# Patient Record
Sex: Male | Born: 1995 | Race: White | Hispanic: No | Marital: Single
Health system: Southern US, Community
[De-identification: ages and names within clinical notes are randomized; demographics above are authoritative.]

---

## 2021-01-19 ENCOUNTER — Encounter (HOSPITAL_COMMUNITY): Payer: Self-pay

## 2021-01-19 ENCOUNTER — Observation Stay (HOSPITAL_COMMUNITY)
Admission: EM | Admit: 2021-01-19 | Discharge: 2021-01-20 | Disposition: A | Discharge status: Home / Self Care | Payer: 59 | Attending: Surgery | Admitting: Surgery

## 2021-01-19 DIAGNOSIS — K353 Acute appendicitis with localized peritonitis, without perforation or gangrene: Principal | ICD-10-CM | POA: Insufficient documentation

## 2021-01-19 DIAGNOSIS — U071 COVID-19: Secondary | ICD-10-CM | POA: Insufficient documentation

## 2021-01-19 DIAGNOSIS — R1031 Right lower quadrant pain: Secondary | ICD-10-CM | POA: Diagnosis present

## 2021-01-19 DIAGNOSIS — K3580 Unspecified acute appendicitis: Secondary | ICD-10-CM | POA: Diagnosis present

## 2021-01-19 LAB — CBC
HCT: 42.7 % (ref 39.0–52.0)
Hemoglobin: 14.5 g/dL (ref 13.0–17.0)
MCH: 29.7 pg (ref 26.0–34.0)
MCHC: 34 g/dL (ref 30.0–36.0)
MCV: 87.5 fL (ref 80.0–100.0)
Platelets: 195 10*3/uL (ref 150–400)
RBC: 4.88 MIL/uL (ref 4.22–5.81)
RDW: 12 % (ref 11.5–15.5)
WBC: 16.2 10*3/uL — ABNORMAL HIGH (ref 4.0–10.5)
nRBC: 0 % (ref 0.0–0.2)

## 2021-01-19 LAB — COMPREHENSIVE METABOLIC PANEL
ALT: 30 U/L (ref 0–44)
AST: 24 U/L (ref 15–41)
Albumin: 4.6 g/dL (ref 3.5–5.0)
Alkaline Phosphatase: 58 U/L (ref 38–126)
Anion gap: 9 (ref 5–15)
BUN: 14 mg/dL (ref 6–20)
CO2: 27 mmol/L (ref 22–32)
Calcium: 9.8 mg/dL (ref 8.9–10.3)
Chloride: 101 mmol/L (ref 98–111)
Creatinine, Ser: 1.1 mg/dL (ref 0.61–1.24)
GFR, Estimated: >60 mL/min (ref >60)
Glucose, Bld: 98 mg/dL (ref 70–99)
Potassium: 4 mmol/L (ref 3.5–5.1)
Sodium: 137 mmol/L (ref 135–145)
Total Bilirubin: 1.3 mg/dL — ABNORMAL HIGH (ref 0.3–1.2)
Total Protein: 8.4 g/dL — ABNORMAL HIGH (ref 6.5–8.1)

## 2021-01-19 LAB — URINALYSIS, ROUTINE W REFLEX MICROSCOPIC
Bilirubin Urine: NEGATIVE
Glucose, UA: NEGATIVE mg/dL
Hgb urine dipstick: NEGATIVE
Ketones, ur: NEGATIVE mg/dL
Leukocytes,Ua: NEGATIVE
Nitrite: NEGATIVE
Protein, ur: NEGATIVE mg/dL
Specific Gravity, Urine: 1.016 (ref 1.005–1.030)
pH: 7 (ref 5.0–8.0)

## 2021-01-19 LAB — LIPASE, BLOOD: Lipase: 114 U/L — ABNORMAL HIGH (ref 11–51)

## 2021-01-19 NOTE — ED Triage Notes (Signed)
Pt states that he has been having RLQ abd pain since last night, with n/v, denies diarrhea or fevers. C/o of some dysuria as well

## 2021-01-20 ENCOUNTER — Observation Stay (HOSPITAL_COMMUNITY): Payer: 59 | Admitting: Anesthesiology

## 2021-01-20 ENCOUNTER — Encounter (HOSPITAL_COMMUNITY)
Admission: EM | Disposition: A | Discharge status: Home / Self Care | Payer: Self-pay | Source: Home / Self Care | Attending: Emergency Medicine

## 2021-01-20 ENCOUNTER — Emergency Department (HOSPITAL_COMMUNITY): Payer: 59

## 2021-01-20 DIAGNOSIS — K3580 Unspecified acute appendicitis: Secondary | ICD-10-CM | POA: Diagnosis present

## 2021-01-20 HISTORY — PX: LAPAROSCOPIC APPENDECTOMY: SHX408

## 2021-01-20 LAB — RESP PANEL BY RT-PCR (FLU A&B, COVID) ARPGX2
Influenza A by PCR: NEGATIVE
Influenza B by PCR: NEGATIVE
SARS Coronavirus 2 by RT PCR: POSITIVE — AB

## 2021-01-20 LAB — HIV ANTIBODY (ROUTINE TESTING W REFLEX): HIV Screen 4th Generation wRfx: NONREACTIVE

## 2021-01-20 SURGERY — APPENDECTOMY, LAPAROSCOPIC
Anesthesia: General

## 2021-01-20 MED ORDER — FENTANYL CITRATE (PF) 250 MCG/5ML IJ SOLN
INTRAMUSCULAR | Status: AC
  Filled 2021-01-20: qty 5

## 2021-01-20 MED ORDER — ROCURONIUM BROMIDE 10 MG/ML (PF) SYRINGE
PREFILLED_SYRINGE | INTRAVENOUS | Status: AC
  Filled 2021-01-20: qty 10

## 2021-01-20 MED ORDER — DEXAMETHASONE SODIUM PHOSPHATE 10 MG/ML IJ SOLN
INTRAMUSCULAR | Status: DC | PRN
  Administered 2021-01-20: 5 mg via INTRAVENOUS

## 2021-01-20 MED ORDER — ACETAMINOPHEN 325 MG PO TABS
ORAL_TABLET | ORAL | Status: AC
  Filled 2021-01-20: qty 2

## 2021-01-20 MED ORDER — ONDANSETRON HCL 4 MG/2ML IJ SOLN
4.0000 mg | Freq: Once | INTRAMUSCULAR | Status: AC
  Administered 2021-01-20: 4 mg via INTRAVENOUS
  Filled 2021-01-20: qty 2

## 2021-01-20 MED ORDER — FENTANYL CITRATE (PF) 250 MCG/5ML IJ SOLN
INTRAMUSCULAR | Status: DC | PRN
  Administered 2021-01-20 (×2): 50 ug via INTRAVENOUS
  Administered 2021-01-20: 150 ug via INTRAVENOUS

## 2021-01-20 MED ORDER — SIMETHICONE 80 MG PO CHEW: 40.0000 mg | CHEWABLE_TABLET | Freq: Four times a day (QID) | ORAL | Status: DC | PRN

## 2021-01-20 MED ORDER — IOHEXOL 300 MG/ML  SOLN
100.0000 mL | Freq: Once | INTRAMUSCULAR | Status: AC | PRN
  Administered 2021-01-20: 100 mL via INTRAVENOUS

## 2021-01-20 MED ORDER — HYDRALAZINE HCL 20 MG/ML IJ SOLN: 10.0000 mg | INTRAMUSCULAR | Status: DC | PRN

## 2021-01-20 MED ORDER — 0.9 % SODIUM CHLORIDE (POUR BTL) OPTIME
TOPICAL | Status: DC | PRN
  Administered 2021-01-20: 1000 mL

## 2021-01-20 MED ORDER — MORPHINE SULFATE (PF) 4 MG/ML IV SOLN
4.0000 mg | Freq: Once | INTRAVENOUS | Status: AC
  Administered 2021-01-20: 4 mg via INTRAVENOUS
  Filled 2021-01-20: qty 1

## 2021-01-20 MED ORDER — IOHEXOL 9 MG/ML PO SOLN: 500.0000 mL | ORAL | Status: DC

## 2021-01-20 MED ORDER — PIPERACILLIN-TAZOBACTAM 3.375 G IVPB
3.3750 g | Freq: Three times a day (TID) | INTRAVENOUS | Status: DC
  Administered 2021-01-20: 3.375 g via INTRAVENOUS
  Filled 2021-01-20 (×2): qty 50

## 2021-01-20 MED ORDER — HEPARIN SODIUM (PORCINE) 5000 UNIT/ML IJ SOLN
5000.0000 [IU] | Freq: Three times a day (TID) | INTRAMUSCULAR | Status: DC
  Filled 2021-01-20: qty 1

## 2021-01-20 MED ORDER — IBUPROFEN 600 MG PO TABS: 600.0000 mg | ORAL_TABLET | Freq: Four times a day (QID) | ORAL | Status: DC | PRN

## 2021-01-20 MED ORDER — DIPHENHYDRAMINE HCL 12.5 MG/5ML PO ELIX: 12.5000 mg | ORAL_SOLUTION | Freq: Four times a day (QID) | ORAL | Status: DC | PRN

## 2021-01-20 MED ORDER — ONDANSETRON HCL 4 MG/2ML IJ SOLN
INTRAMUSCULAR | Status: DC | PRN
  Administered 2021-01-20: 4 mg via INTRAVENOUS

## 2021-01-20 MED ORDER — MIDAZOLAM HCL 5 MG/5ML IJ SOLN
INTRAMUSCULAR | Status: DC | PRN
  Administered 2021-01-20: 2 mg via INTRAVENOUS

## 2021-01-20 MED ORDER — LIDOCAINE 2% (20 MG/ML) 5 ML SYRINGE
INTRAMUSCULAR | Status: DC | PRN
  Administered 2021-01-20: 40 mg via INTRAVENOUS

## 2021-01-20 MED ORDER — ACETAMINOPHEN 325 MG PO TABS
650.0000 mg | ORAL_TABLET | Freq: Four times a day (QID) | ORAL | Status: DC | PRN
  Administered 2021-01-20: 650 mg via ORAL

## 2021-01-20 MED ORDER — ACETAMINOPHEN 650 MG RE SUPP: 650.0000 mg | Freq: Four times a day (QID) | RECTAL | Status: DC | PRN

## 2021-01-20 MED ORDER — DOCUSATE SODIUM 100 MG PO CAPS: 100.0000 mg | ORAL_CAPSULE | Freq: Two times a day (BID) | ORAL | Status: DC

## 2021-01-20 MED ORDER — ACETAMINOPHEN 500 MG PO TABS
1000.0000 mg | ORAL_TABLET | Freq: Four times a day (QID) | ORAL | Status: DC
  Administered 2021-01-20: 1000 mg via ORAL
  Filled 2021-01-20 (×2): qty 2

## 2021-01-20 MED ORDER — LACTATED RINGERS IV SOLN: INTRAVENOUS | Status: DC

## 2021-01-20 MED ORDER — METRONIDAZOLE 500 MG/100ML IV SOLN
500.0000 mg | Freq: Once | INTRAVENOUS | Status: AC
  Administered 2021-01-20: 500 mg via INTRAVENOUS
  Filled 2021-01-20: qty 100

## 2021-01-20 MED ORDER — PROPOFOL 10 MG/ML IV BOLUS
INTRAVENOUS | Status: AC
  Filled 2021-01-20: qty 20

## 2021-01-20 MED ORDER — ONDANSETRON 4 MG PO TBDP: 4.0000 mg | ORAL_TABLET | Freq: Four times a day (QID) | ORAL | Status: DC | PRN

## 2021-01-20 MED ORDER — SODIUM CHLORIDE 0.9 % IV SOLN
2.0000 g | Freq: Once | INTRAVENOUS | Status: AC
  Administered 2021-01-20: 2 g via INTRAVENOUS
  Filled 2021-01-20: qty 20

## 2021-01-20 MED ORDER — DEXAMETHASONE SODIUM PHOSPHATE 10 MG/ML IJ SOLN
INTRAMUSCULAR | Status: AC
  Filled 2021-01-20: qty 1

## 2021-01-20 MED ORDER — MIDAZOLAM HCL 2 MG/2ML IJ SOLN
INTRAMUSCULAR | Status: AC
  Filled 2021-01-20: qty 2

## 2021-01-20 MED ORDER — DIPHENHYDRAMINE HCL 50 MG/ML IJ SOLN: 12.5000 mg | Freq: Four times a day (QID) | INTRAMUSCULAR | Status: DC | PRN

## 2021-01-20 MED ORDER — TRAMADOL HCL 50 MG PO TABS: 50.0000 mg | ORAL_TABLET | Freq: Four times a day (QID) | ORAL | Status: DC | PRN

## 2021-01-20 MED ORDER — LIDOCAINE 2% (20 MG/ML) 5 ML SYRINGE
INTRAMUSCULAR | Status: AC
  Filled 2021-01-20: qty 5

## 2021-01-20 MED ORDER — PIPERACILLIN-TAZOBACTAM 3.375 G IVPB: 3.3750 g | Freq: Three times a day (TID) | INTRAVENOUS | Status: DC

## 2021-01-20 MED ORDER — ONDANSETRON HCL 4 MG/2ML IJ SOLN
INTRAMUSCULAR | Status: AC
  Filled 2021-01-20: qty 2

## 2021-01-20 MED ORDER — SUGAMMADEX SODIUM 200 MG/2ML IV SOLN
INTRAVENOUS | Status: DC | PRN
  Administered 2021-01-20: 200 mg via INTRAVENOUS

## 2021-01-20 MED ORDER — ROCURONIUM BROMIDE 10 MG/ML (PF) SYRINGE
PREFILLED_SYRINGE | INTRAVENOUS | Status: DC | PRN
  Administered 2021-01-20: 20 mg via INTRAVENOUS
  Administered 2021-01-20: 50 mg via INTRAVENOUS

## 2021-01-20 MED ORDER — BUPIVACAINE-EPINEPHRINE 0.25% -1:200000 IJ SOLN
INTRAMUSCULAR | Status: DC | PRN
  Administered 2021-01-20: 6 mL

## 2021-01-20 MED ORDER — IOHEXOL 9 MG/ML PO SOLN
ORAL | Status: AC
  Filled 2021-01-20: qty 1000

## 2021-01-20 MED ORDER — ONDANSETRON HCL 4 MG/2ML IJ SOLN: 4.0000 mg | Freq: Four times a day (QID) | INTRAMUSCULAR | Status: DC | PRN

## 2021-01-20 MED ORDER — PROPOFOL 10 MG/ML IV BOLUS
INTRAVENOUS | Status: DC | PRN
  Administered 2021-01-20: 140 mg via INTRAVENOUS
  Administered 2021-01-20: 30 mg via INTRAVENOUS

## 2021-01-20 MED ORDER — KETOROLAC TROMETHAMINE 30 MG/ML IJ SOLN
INTRAMUSCULAR | Status: DC | PRN
  Administered 2021-01-20: 30 mg via INTRAVENOUS

## 2021-01-20 MED ORDER — BUPIVACAINE-EPINEPHRINE (PF) 0.25% -1:200000 IJ SOLN
INTRAMUSCULAR | Status: AC
  Filled 2021-01-20: qty 30

## 2021-01-20 MED ORDER — OXYCODONE HCL 5 MG PO TABS: 5.0000 mg | ORAL_TABLET | Freq: Four times a day (QID) | ORAL | 0 refills | Status: AC | PRN

## 2021-01-20 SURGICAL SUPPLY — 38 items
APPLIER CLIP 5 13 M/L LIGAMAX5 (MISCELLANEOUS)
BAG COUNTER SPONGE SURGICOUNT (BAG) ×2 IMPLANT
CANISTER SUCT 3000ML PPV (MISCELLANEOUS) ×2 IMPLANT
CHLORAPREP W/TINT 26 (MISCELLANEOUS) ×2 IMPLANT
CLIP APPLIE 5 13 M/L LIGAMAX5 (MISCELLANEOUS) IMPLANT
COVER SURGICAL LIGHT HANDLE (MISCELLANEOUS) ×2 IMPLANT
CUTTER FLEX LINEAR 45M (STAPLE) ×2 IMPLANT
DERMABOND ADVANCED (GAUZE/BANDAGES/DRESSINGS) ×1
DERMABOND ADVANCED .7 DNX12 (GAUZE/BANDAGES/DRESSINGS) ×1 IMPLANT
ELECT REM PT RETURN 9FT ADLT (ELECTROSURGICAL) ×2
ELECTRODE REM PT RTRN 9FT ADLT (ELECTROSURGICAL) ×1 IMPLANT
GLOVE SURG ENC MOIS LTX SZ7 (GLOVE) ×2 IMPLANT
GLOVE SURG UNDER POLY LF SZ7.5 (GLOVE) ×2 IMPLANT
GOWN STRL REUS W/ TWL LRG LVL3 (GOWN DISPOSABLE) ×3 IMPLANT
GOWN STRL REUS W/TWL LRG LVL3 (GOWN DISPOSABLE) ×3
GRASPER SUT TROCAR 14GX15 (MISCELLANEOUS) ×2 IMPLANT
KIT BASIN OR (CUSTOM PROCEDURE TRAY) ×2 IMPLANT
KIT TURNOVER KIT B (KITS) ×2 IMPLANT
NS IRRIG 1000ML POUR BTL (IV SOLUTION) ×2 IMPLANT
POUCH RETRIEVAL ECOSAC 10 (ENDOMECHANICALS) ×1 IMPLANT
POUCH RETRIEVAL ECOSAC 10MM (ENDOMECHANICALS) ×1
RELOAD 45 VASCULAR/THIN (ENDOMECHANICALS) ×2 IMPLANT
RELOAD STAPLE TA45 3.5 REG BLU (ENDOMECHANICALS) ×2 IMPLANT
SCISSORS LAP 5X35 DISP (ENDOMECHANICALS) ×2 IMPLANT
SET IRRIG TUBING LAPAROSCOPIC (IRRIGATION / IRRIGATOR) ×2 IMPLANT
SET TUBE SMOKE EVAC HIGH FLOW (TUBING) ×2 IMPLANT
SHEARS HARMONIC ACE PLUS 36CM (ENDOMECHANICALS) ×2 IMPLANT
SLEEVE ENDOPATH XCEL 5M (ENDOMECHANICALS) ×2 IMPLANT
SPECIMEN JAR SMALL (MISCELLANEOUS) ×2 IMPLANT
STRIP CLOSURE SKIN 1/2X4 (GAUZE/BANDAGES/DRESSINGS) ×2 IMPLANT
SUT MNCRL AB 4-0 PS2 18 (SUTURE) ×2 IMPLANT
SUT VICRYL 0 UR6 27IN ABS (SUTURE) ×2 IMPLANT
TOWEL GREEN STERILE (TOWEL DISPOSABLE) ×2 IMPLANT
TOWEL GREEN STERILE FF (TOWEL DISPOSABLE) ×2 IMPLANT
TRAY LAPAROSCOPIC MC (CUSTOM PROCEDURE TRAY) ×2 IMPLANT
TROCAR XCEL BLUNT TIP 100MML (ENDOMECHANICALS) ×2 IMPLANT
TROCAR XCEL NON-BLD 5MMX100MML (ENDOMECHANICALS) ×2 IMPLANT
WATER STERILE IRR 1000ML POUR (IV SOLUTION) ×2 IMPLANT

## 2021-01-20 NOTE — ED Provider Notes (Signed)
Clarence Hancock   CSN: [id number] Arrival date & time: 01/19/21  1912     History Chief Complaint  Patient presents with   Abdominal Pain    Clarence Hancock is a 25 y.o. male.  The history is provided by the patient. No language interpreter was used.  Abdominal Pain  25 year old male who presents for evaluation of abdominal pain.  Patient report he developed mid abdominal pain last night with associated nausea and vomiting.  Today pain persist but now localized to his right lower quadrant.  Pain is sharp, rates as 7 out of 10, worse with palpation or with movement.  He endorsed decrease in appetite.  Denies having fever runny nose sneezing coughing chest pain shortness of breath urinary symptoms testicular pain or penile discharge.  He denies alcohol or tobacco abuse.  No specific treatment tried.  No prior abdominal surgery.  Normal bowel movement.  He has been vaccinated for COVID.   History reviewed. No pertinent past medical history.  There are no problems to display for this patient.   History reviewed. No pertinent surgical history.     No family history on file.     Home Medications Prior to Admission medications   Not on File    Allergies    Patient has no known allergies.  Review of Systems   Review of Systems  Gastrointestinal:  Positive for abdominal pain.  All other systems reviewed and are negative.  Physical Exam Updated Vital Signs BP 139/62 (BP Location: Left Arm)   Pulse (!) 54   Temp 98.3 F (36.8 C) (Oral)   Resp 14   SpO2 100%   Physical Exam Vitals and nursing Hancock reviewed.  Constitutional:      General: He is not in acute distress.    Appearance: He is well-developed.  HENT:     Head: Atraumatic.  Eyes:     Conjunctiva/sclera: Conjunctivae normal.  Cardiovascular:     Rate and Rhythm: Normal rate and regular rhythm.     Heart sounds: Normal heart sounds.  Pulmonary:      Effort: Pulmonary effort is normal.     Breath sounds: Normal breath sounds.  Abdominal:     General: Abdomen is flat.     Palpations: Abdomen is soft.     Tenderness: There is abdominal tenderness in the right lower quadrant. There is no right CVA tenderness, left CVA tenderness, guarding or rebound. Positive signs include McBurney's sign. Negative signs include Murphy's sign and Rovsing's sign.  Musculoskeletal:     Cervical back: Neck supple.  Skin:    Findings: No rash.  Neurological:     Mental Status: He is alert.  Psychiatric:        Mood and Affect: Mood normal.    ED Results / Procedures / Treatments   Labs (all labs ordered are listed, but only abnormal results are displayed) Labs Reviewed  RESP PANEL BY RT-PCR (FLU A&B, COVID) ARPGX2 - Abnormal; Notable for the following components:      Result Value   SARS Coronavirus 2 by RT PCR POSITIVE (*)    All other components within normal limits  LIPASE, BLOOD - Abnormal; Notable for the following components:   Lipase 114 (*)    All other components within normal limits  COMPREHENSIVE METABOLIC PANEL - Abnormal; Notable for the following components:   Total Protein 8.4 (*)    Total Bilirubin 1.3 (*)    All other components within  normal limits  CBC - Abnormal; Notable for the following components:   WBC 16.2 (*)    All other components within normal limits  URINALYSIS, ROUTINE W REFLEX MICROSCOPIC    EKG None  Radiology CT ABDOMEN PELVIS W CONTRAST  Result Date: 01/20/2021 CLINICAL DATA:  26 year old male with right lower quadrant abdominal pain. EXAM: CT ABDOMEN AND PELVIS WITH CONTRAST TECHNIQUE: Multidetector CT imaging of the abdomen and pelvis was performed using the standard protocol following bolus administration of intravenous contrast. CONTRAST:  OMNIPAQUE IOHEXOL 300 MG/ML  SOLN COMPARISON:  None. FINDINGS: Lower chest: Negative. Hepatobiliary: Negative liver and gallbladder. Pancreas: Negative. Spleen:  Negative. Adrenals/Urinary Tract: Negative.  Incidental pelvic phleboliths. Stomach/Bowel: Appendix: Location: Caudal to the cecum, arising on coronal image 67 and continuing through image 57. Diameter: Up to 14 mm. Appendicolith: None identified. Mucosal hyper-enhancement: Positive Extraluminal gas: Negative Periappendiceal collection: Trace free fluid in the right lower quadrant, otherwise only a para appendiceal inflammatory stranding. Associated thickening at the tip of the cecum seen on coronal image 67. Oral contrast has reached the hepatic flexure. The sigmoid colon is redundant. Stomach, duodenum, and small bowel remain within normal limits. No free air. Vascular/Lymphatic: Major arterial structures and the portal venous system appear to be patent and normal. No lymphadenopathy identified. Reproductive: Negative. Other: Moderate volume pelvic free fluid with simple to mildly complex fluid density. Musculoskeletal: Negative. IMPRESSION: Positive for Acute Appendicitis. No periappendiceal abscess, and no extraluminal gas to suggest perforation. A moderate volume of pelvic free fluid is therefore felt to be reactive. Electronically Signed   By: Odessa Fleming M.D.   On: 01/20/2021 05:24    Procedures Procedures   Medications Ordered in ED Medications  iohexol (OMNIPAQUE) 9 MG/ML oral solution 500 mL ( Oral Contrast Given 01/20/21 0233)  cefTRIAXone (ROCEPHIN) 2 g in sodium chloride 0.9 % 100 mL IVPB (has no administration in time range)    And  metroNIDAZOLE (FLAGYL) IVPB 500 mg (has no administration in time range)  morphine 4 MG/ML injection 4 mg (4 mg Intravenous Given 01/20/21 0131)  ondansetron (ZOFRAN) injection 4 mg (4 mg Intravenous Given 01/20/21 0130)  iohexol (OMNIPAQUE) 300 MG/ML solution 100 mL (100 mLs Intravenous Contrast Given 01/20/21 0452)    ED Course  I have reviewed the triage vital signs and the nursing notes.  Pertinent labs & imaging results that were available during my care of  the patient were reviewed by me and considered in my medical decision making (see chart for details).    MDM Rules/Calculators/A&P                          BP 135/68   Pulse (!) 59   Temp 98.7 F (37.1 C) (Oral)   Resp 16   SpO2 99%   Final Clinical Impression(s) / ED Diagnoses Final diagnoses:  Acute appendicitis, unspecified acute appendicitis type  COVID-19 virus infection    Rx / DC Orders ED Discharge Orders     None      1:14 AM Patient here with initial periumbilical abdominal pain now radiates to his right lower quadrant with associate nausea and vomiting and decrease in appetite.  He does have point tenderness to right lower quadrant on exam.  Labs remarkable for an elevated white count of 16.2.  Elevated lipase of 114 however patient without any significant risk factor for pancreatitis and he also does not have any left upper quadrant abdominal pain.  Patient  would benefit from an abdominal pelvis CT scan to assess for potential intra-abdominal infection such as appendicitis.  Pain medication and antinausea medication provided.  Patient made NPO.  Screening COVID test ordered.  6:02 AM CT scan demonstrates signs of appendicitis.  Will wait for official radiology read..  Patient also test positive for COVID-19.  He has been vaccinated for COVID and currently without any COVID symptoms.  Appreciate consultation from on-call general surgeon Dr. Cliffton Asters who agrees to see patient and he recommends starting patient on Rocephin and Flagyl.  I discussed finding with patient who agrees with plan.  At this time his pain is controlled.   Fayrene Helper, PA-C 01/20/21 [id number]    Zadie Rhine, MD 01/21/21 [phone]

## 2021-01-20 NOTE — Discharge Summary (Signed)
    Patient ID: Clarence Hancock [id number] 1996/03/25 25 y.o.  Admit date: 01/19/2021 Discharge date: 01/20/2021  Admitting Diagnosis: Acute appendicitis Incidentally found to be COVID+  Discharge Diagnosis Patient Active Problem List   Diagnosis Date Noted   Acute appendicitis 01/20/2021  COVID +   Consultants None   Reason for Admission: Clarence Hancock is an 25 y.o. male with no known medical history whom presented to the ER with right lower quadrant abdominal pain that began overnight.  He has never had this kind of pain before.  He describes it as being sharp and severe.  The pain is made worse with movement and alleviated to some degree with lying still.  He denies any associated nausea or vomiting.  He denies any issues with constipation or diarrhea.  He denies any blood in his stool.  He denies any known family history of inflammatory bowel disease including Crohn's or ulcerative colitis.  Procedures Dr. Dwain Sarna - Laparoscopic Appendectomy - 01/20/2021  Hospital Course:  The patient was admitted and underwent a laparoscopic appendectomy.  The patient tolerated the procedure well.  He was discharged home from PACU.  Follow up as arranged below.   I was not directly involved in this patient's care and did not see the patient during their hospital stay, therefore the information in this discharge summary was taken entirely from the chart.  Allergies as of 01/20/2021   No Known Allergies      Medication List     TAKE these medications    acetaminophen 500 MG tablet Commonly known as: TYLENOL Take 1,000 mg by mouth every 6 (six) hours as needed for mild pain.   dicyclomine 20 MG tablet Commonly known as: BENTYL Take 20 mg by mouth 4 (four) times daily.   ibuprofen 200 MG tablet Commonly known as: ADVIL Take 400-600 mg by mouth every 6 (six) hours as needed for headache.   ondansetron 4 MG disintegrating tablet Commonly known as: ZOFRAN-ODT Take 4 mg by mouth  every 6 (six) hours as needed for nausea/vomiting.   oxyCODONE 5 MG immediate release tablet Commonly known as: Roxicodone Take 1 tablet (5 mg total) by mouth every 6 (six) hours as needed for breakthrough pain.          Follow-up Information     [facility] Follow up.   Specialty: General Surgery Why: Please call to confirm your appointment time. We are working hard to make this for you. Please arrive 30 minutes prior to your appointment for paperwork. Please bring a copy of your photo ID and insurance card. Contact information: [address] [place] [address] [phone]                 Signed: Leary Roca, [facility] 01/20/2021, 4:09 PM Please see Amion for pager number during day hours 7:00am-4:30pm

## 2021-01-20 NOTE — Interval H&P Note (Signed)
History and Physical Interval Note:  01/20/2021 3:04 PM Have seen and examined patient. Covid likely three weeks ago. Plan for lap appy Domnick Chervenak  has presented today for surgery, with the diagnosis of appendicitis.  The various methods of treatment have been discussed with the patient and family. After consideration of risks, benefits and other options for treatment, the patient has consented to  Procedure(s): APPENDECTOMY LAPAROSCOPIC (N/A) as a surgical intervention.  The patient's history has been reviewed, patient examined, no change in status, stable for surgery.  I have reviewed the patient's chart and labs.  Questions were answered to the patient's satisfaction.     Emelia Loron

## 2021-01-20 NOTE — H&P (Addendum)
CC: Right lower abdominal pain  Requesting provider: Fayrene Helper PA-C  HPI: Clarence Hancock is an 25 y.o. male with no known medical history whom presented to the ER with right lower quadrant abdominal pain that began overnight.  He has never had this kind of pain before.  He describes it as being sharp and severe.  The pain is made worse with movement and alleviated to some degree with lying still.  He denies any associated nausea or vomiting.  He denies any issues with constipation or diarrhea.  He denies any blood in his stool.  He denies any known family history of inflammatory bowel disease including Crohn's or ulcerative colitis.  History reviewed. No pertinent past medical history.  History reviewed. No pertinent surgical history.  PSH: Denies  PMH: Denies  Fhx: Denies family hx of IBD, Crohn's, UC or colorectal cancer  Social:  has no history on file for tobacco use, alcohol use, and drug use.  Allergies: No Known Allergies  Medications: I have reviewed the patient's current medications.  Results for orders placed or performed during the hospital encounter of 01/19/21 (from the past 48 hour(s))  Urinalysis, Routine w reflex microscopic Urine, Clean Catch     Status: None   Collection Time: 01/19/21  8:07 PM  Result Value Ref Range   Color, Urine YELLOW YELLOW   APPearance CLEAR CLEAR   Specific Gravity, Urine 1.016 1.005 - 1.030   pH 7.0 5.0 - 8.0   Glucose, UA NEGATIVE NEGATIVE mg/dL   Hgb urine dipstick NEGATIVE NEGATIVE   Bilirubin Urine NEGATIVE NEGATIVE   Ketones, ur NEGATIVE NEGATIVE mg/dL   Protein, ur NEGATIVE NEGATIVE mg/dL   Nitrite NEGATIVE NEGATIVE   Leukocytes,Ua NEGATIVE NEGATIVE    Comment: Performed at [facility], [address] [place], [place] [address]  Lipase, blood     Status: Abnormal   Collection Time: 01/19/21  8:10 PM  Result Value Ref Range   Lipase 114 (H) 11 - 51 U/L    Comment: Performed at [facility], [address], [place], [place] [address]  Comprehensive metabolic panel     Status: Abnormal   Collection Time: 01/19/21  8:10 PM  Result Value Ref Range   Sodium 137 135 - 145 mmol/L   Potassium 4.0 3.5 - 5.1 mmol/L   Chloride 101 98 - 111 mmol/L   CO2 27 22 - 32 mmol/L   Glucose, Bld 98 70 - 99 mg/dL    Comment: Glucose reference range applies only to samples taken after fasting for at least 8 hours.   BUN 14 6 - 20 mg/dL   Creatinine, Ser 7.65 0.61 - 1.24 mg/dL   Calcium 9.8 8.9 - 46.5 mg/dL   Total Protein 8.4 (H) 6.5 - 8.1 g/dL   Albumin 4.6 3.5 - 5.0 g/dL   AST 24 15 - 41 U/L   ALT 30 0 - 44 U/L   Alkaline Phosphatase 58 38 - 126 U/L   Total Bilirubin 1.3 (H) 0.3 - 1.2 mg/dL   GFR, Estimated >03 >54 mL/min    Comment: (NOTE) Calculated using the CKD-EPI Creatinine Equation (2021)    Anion gap 9 5 - 15    Comment: Performed at [facility], [address] [place], [place] [address]  CBC     Status: Abnormal   Collection Time: 01/19/21  8:10 PM  Result Value Ref Range   WBC 16.2 (H) 4.0 - 10.5 K/uL   RBC  4.88 4.22 - 5.81 MIL/uL   Hemoglobin 14.5 13.0 - 17.0 g/dL   HCT 26.2 03.5 - 59.7 %   MCV 87.5 80.0 - 100.0 fL   MCH 29.7 26.0 - 34.0 pg   MCHC 34.0 30.0 - 36.0 g/dL   RDW 41.6 38.4 - 53.6 %   Platelets 195 150 - 400 K/uL   nRBC 0.0 0.0 - 0.2 %    Comment: Performed at [facility], [address], [place], [place] [address]  Resp Panel by RT-PCR (Flu A&B, Covid) Nasopharyngeal Swab     Status: Abnormal   Collection Time: 01/20/21  1:14 AM   Specimen: Nasopharyngeal Swab; Nasopharyngeal(NP) swabs in vial transport medium  Result Value Ref Range   SARS Coronavirus 2 by RT PCR POSITIVE (A) NEGATIVE    Comment: RESULT CALLED TO, READ BACK BY AND VERIFIED WITH: D. HARRIS RN, AT 2122 01/20/21 D. Leighton Roach (NOTE) SARS-CoV-2 target nucleic acids are DETECTED.  The SARS-CoV-2 RNA is generally detectable in upper respiratory specimens during the acute phase of infection.  Positive results are indicative of the presence of the identified virus, but do not rule out bacterial infection or co-infection with other pathogens not detected by the test. Clinical correlation with patient history and other diagnostic information is necessary to determine patient infection status. The expected result is Negative.  Fact Sheet for Patients: BloggerCourse.com  Fact Sheet for Healthcare Providers: SeriousBroker.it  This test is not yet approved or cleared by the [place] FDA and  has been authorized for detection and/or diagnosis of SARS-CoV-2 by FDA under an Emergency Use Authorization (EUA).  This EUA will remain in effect (meaning this test  can be used) for the duration of  the COVID-19 declaration under Section 564(b)(1) of the Act, 21 U.S.C. section 360bbb-3(b)(1), unless the authorization is terminated or revoked sooner.     Influenza A by PCR NEGATIVE NEGATIVE   Influenza B by PCR NEGATIVE NEGATIVE    Comment: (NOTE) The Xpert Xpress SARS-CoV-2/FLU/RSV plus assay is intended as an aid in the diagnosis of influenza from Nasopharyngeal swab specimens and should not be used as a sole basis for treatment. Nasal washings and aspirates are unacceptable for Xpert Xpress SARS-CoV-2/FLU/RSV testing.  Fact Sheet for Patients: BloggerCourse.com  Fact Sheet for Healthcare Providers: SeriousBroker.it  This test is not yet approved or cleared by the [place] FDA and has been authorized for detection and/or diagnosis of SARS-CoV-2 by FDA under an Emergency Use Authorization (EUA). This EUA will remain in effect (meaning this test can be used) for the duration of the COVID-19 declaration under Section 564(b)(1) of the Act, 21 U.S.C. section 360bbb-3(b)(1), unless the authorization is terminated or revoked.  Performed at [facility], [address], [place], [place] [address]     CT ABDOMEN PELVIS W CONTRAST  Result Date: 01/20/2021 CLINICAL DATA:  25 year old male with right lower quadrant abdominal pain. EXAM: CT ABDOMEN AND PELVIS WITH CONTRAST TECHNIQUE: Multidetector CT imaging of the abdomen and pelvis was performed using the standard protocol following bolus administration of intravenous contrast. CONTRAST:  OMNIPAQUE IOHEXOL 300 MG/ML  SOLN COMPARISON:  None. FINDINGS: Lower chest: Negative. Hepatobiliary: Negative liver and gallbladder. Pancreas: Negative. Spleen: Negative. Adrenals/Urinary Tract: Negative.  Incidental pelvic phleboliths. Stomach/Bowel: Appendix: Location: Caudal to the cecum, arising on coronal image 67 and continuing through image 57. Diameter: Up to 14 mm. Appendicolith: None identified. Mucosal hyper-enhancement: Positive Extraluminal gas: Negative Periappendiceal collection: Trace free fluid in  the right lower quadrant, otherwise only a para appendiceal inflammatory stranding. Associated thickening at the tip of the cecum seen on coronal image 67. Oral contrast has reached the hepatic flexure. The sigmoid colon is redundant. Stomach, duodenum, and small bowel remain within normal limits. No free air. Vascular/Lymphatic: Major arterial structures and the portal venous system appear to be patent and normal. No lymphadenopathy identified. Reproductive: Negative. Other: Moderate volume pelvic free fluid with simple to mildly complex fluid density. Musculoskeletal: Negative. IMPRESSION: Positive for Acute Appendicitis. No periappendiceal abscess, and no extraluminal gas to suggest perforation. A moderate volume of pelvic free fluid is therefore felt to be reactive. Electronically Signed   By: Odessa FlemingH  Hall M.D.   On: 01/20/2021 05:24    ROS - all of the below systems have been reviewed with the patient and positives are indicated with bold text General: chills, fever or night sweats Eyes: blurry vision or double vision ENT:  epistaxis or sore throat Allergy/Immunology: itchy/watery eyes or nasal congestion Hematologic/Lymphatic: bleeding problems, blood clots or swollen lymph nodes Endocrine: temperature intolerance or unexpected weight changes Breast: new or changing breast lumps or nipple discharge Resp: cough, shortness of breath, or wheezing CV: chest pain or dyspnea on exertion GI: as per HPI GU: dysuria, trouble voiding, or hematuria MSK: joint pain or joint stiffness Neuro: TIA or stroke symptoms Derm: pruritus and skin lesion changes Psych: anxiety and depression  PE Blood pressure 135/68, pulse (!) 59, temperature 98.7 F (37.1 C), temperature source Oral, resp. rate 16, SpO2 99 %. Constitutional: NAD; conversant; no deformities Eyes: Moist conjunctiva; no lid lag; anicteric; PERRL Neck: Trachea midline; no thyromegaly Lungs: Normal respiratory effort; no tactile fremitus CV: RRR; no palpable thrills; no pitting edema GI: Abd soft, focally tender in RLQ with focal guarding; nondistended ;no tenderness elsewhere; no palpable hepatosplenomegaly MSK: Normal range of motion of extremities; no clubbing/cyanosis Psychiatric: Appropriate affect; alert and oriented x3 Lymphatic: No palpable cervical or axillary lymphadenopathy  Results for orders placed or performed during the hospital encounter of 01/19/21 (from the past 48 hour(s))  Urinalysis, Routine w reflex microscopic Urine, Clean Catch     Status: None   Collection Time: 01/19/21  8:07 PM  Result Value Ref Range   Color, Urine YELLOW YELLOW   APPearance CLEAR CLEAR   Specific Gravity, Urine 1.016 1.005 - 1.030   pH 7.0 5.0 - 8.0   Glucose, UA NEGATIVE NEGATIVE mg/dL   Hgb urine dipstick NEGATIVE NEGATIVE   Bilirubin Urine NEGATIVE NEGATIVE   Ketones, ur NEGATIVE NEGATIVE mg/dL   Protein, ur NEGATIVE NEGATIVE mg/dL   Nitrite NEGATIVE NEGATIVE   Leukocytes,Ua NEGATIVE NEGATIVE    Comment: Performed at [facility], [address] [place], [place] [address]  Lipase, blood     Status: Abnormal   Collection Time: 01/19/21  8:10 PM  Result Value Ref Range   Lipase 114 (H) 11 - 51 U/L    Comment: Performed at [facility], [address] [place], [place] [address]  Comprehensive metabolic panel     Status: Abnormal   Collection Time: 01/19/21  8:10 PM  Result Value Ref Range   Sodium 137 135 - 145 mmol/L   Potassium 4.0 3.5 - 5.1 mmol/L   Chloride 101 98 - 111 mmol/L   CO2 27 22 - 32 mmol/L   Glucose, Bld 98 70 - 99 mg/dL    Comment: Glucose reference range applies only to samples taken after fasting for at  least 8 hours.   BUN 14 6 - 20 mg/dL   Creatinine, Ser 3.14 0.61 - 1.24 mg/dL   Calcium 9.8 8.9 - 97.0 mg/dL   Total Protein 8.4 (H) 6.5 - 8.1 g/dL   Albumin 4.6 3.5 - 5.0 g/dL   AST 24 15 - 41 U/L   ALT 30 0 - 44 U/L   Alkaline Phosphatase 58 38 - 126 U/L   Total Bilirubin 1.3 (H) 0.3 - 1.2 mg/dL   GFR, Estimated >26 >37 mL/min    Comment: (NOTE) Calculated using the CKD-EPI Creatinine Equation (2021)    Anion gap 9 5 - 15    Comment: Performed at [facility], [address] [place], [place] [address]  CBC     Status: Abnormal   Collection Time: 01/19/21  8:10 PM  Result Value Ref Range   WBC 16.2 (H) 4.0 - 10.5 K/uL   RBC 4.88 4.22 - 5.81 MIL/uL   Hemoglobin 14.5 13.0 - 17.0 g/dL   HCT 02.7 74.1 - 28.7 %   MCV 87.5 80.0 - 100.0 fL   MCH 29.7 26.0 - 34.0 pg   MCHC 34.0 30.0 - 36.0 g/dL   RDW 86.7 67.2 - 09.4 %   Platelets 195 150 - 400 K/uL   nRBC 0.0 0.0 - 0.2 %    Comment: Performed at [facility], [address] [place] [address], [place], [place] [address]  Resp Panel by RT-PCR (Flu A&B, Covid) Nasopharyngeal Swab     Status: Abnormal   Collection Time: 01/20/21  1:14 AM   Specimen: Nasopharyngeal Swab; Nasopharyngeal(NP) swabs in vial transport medium  Result Value Ref Range   SARS Coronavirus 2 by RT PCR POSITIVE (A) NEGATIVE    Comment: RESULT CALLED TO, READ BACK BY AND VERIFIED  WITH: D. HARRIS RN, AT 8366 01/20/21 D. Leighton Roach (NOTE) SARS-CoV-2 target nucleic acids are DETECTED.  The SARS-CoV-2 RNA is generally detectable in upper respiratory specimens during the acute phase of infection. Positive results are indicative of the presence of the identified virus, but do not rule out bacterial infection or co-infection with other pathogens not detected by the test. Clinical correlation with patient history and other diagnostic information is necessary to determine patient infection status. The expected result is Negative.  Fact Sheet for Patients: BloggerCourse.com  Fact Sheet for Healthcare Providers: SeriousBroker.it  This test is not yet approved or cleared by the [place] FDA and  has been authorized for detection and/or diagnosis of SARS-CoV-2 by FDA under an Emergency Use Authorization (EUA).  This EUA will remain in effect (meaning this test  can be used) for the duration of  the COVID-19 declaration under Section 564(b)(1) of the Act, 21 U.S.C. section 360bbb-3(b)(1), unless the authorization is terminated or revoked sooner.     Influenza A by PCR NEGATIVE NEGATIVE   Influenza B by PCR NEGATIVE NEGATIVE    Comment: (NOTE) The Xpert Xpress SARS-CoV-2/FLU/RSV plus assay is intended as an aid in the diagnosis of influenza from Nasopharyngeal swab specimens and should not be used as a sole basis for treatment. Nasal washings and aspirates are unacceptable for Xpert Xpress SARS-CoV-2/FLU/RSV testing.  Fact Sheet for Patients: BloggerCourse.com  Fact Sheet for Healthcare Providers: SeriousBroker.it  This test is not yet approved or cleared by the [place] FDA and has been authorized for detection and/or diagnosis of SARS-CoV-2 by FDA under an Emergency Use Authorization (EUA). This EUA will remain in effect (meaning this test can be used) for  the duration of the COVID-19 declaration under Section 564(b)(1) of the Act, 21 U.S.C. section 360bbb-3(b)(1), unless the authorization is terminated or revoked.  Performed at [facility], [address], [place], [place] [address]     CT ABDOMEN PELVIS W CONTRAST  Result Date: 01/20/2021 CLINICAL DATA:  25 year old male with right lower quadrant abdominal pain. EXAM: CT ABDOMEN AND PELVIS WITH CONTRAST TECHNIQUE: Multidetector CT imaging of the abdomen and pelvis was performed using the standard protocol following bolus administration of intravenous contrast. CONTRAST:  OMNIPAQUE IOHEXOL 300 MG/ML  SOLN COMPARISON:  None. FINDINGS: Lower chest: Negative. Hepatobiliary: Negative liver and gallbladder. Pancreas: Negative. Spleen: Negative. Adrenals/Urinary Tract: Negative.  Incidental pelvic phleboliths. Stomach/Bowel: Appendix: Location: Caudal to the cecum, arising on coronal image 67 and continuing through image 57. Diameter: Up to 14 mm. Appendicolith: None identified. Mucosal hyper-enhancement: Positive Extraluminal gas: Negative Periappendiceal collection: Trace free fluid in the right lower quadrant, otherwise only a para appendiceal inflammatory stranding. Associated thickening at the tip of the cecum seen on coronal image 67. Oral contrast has reached the hepatic flexure. The sigmoid colon is redundant. Stomach, duodenum, and small bowel remain within normal limits. No free air. Vascular/Lymphatic: Major arterial structures and the portal venous system appear to be patent and normal. No lymphadenopathy identified. Reproductive: Negative. Other: Moderate volume pelvic free fluid with simple to mildly complex fluid density. Musculoskeletal: Negative. IMPRESSION: Positive for Acute Appendicitis. No periappendiceal abscess, and no extraluminal gas to suggest perforation. A moderate volume of pelvic free fluid is therefore felt to be reactive. Electronically Signed   By: Odessa Fleming M.D.    On: 01/20/2021 05:24     A/P: Clarence Hancock is an 25 y.o. male with acute appendicitis; incidentally found to be COVID+  -The anatomy and physiology of the GI tract was discussed at length with the patient. The pathophysiology of appendicitis was discussed as well. -We reviewed options moving forward for treatment, covering IV abx vs surgery. We discussed that with antibiotics alone, there is reasonable success in managing appendicitis, however, risks of recurrence at 79yrs being as high as 40% in some studies. We discussed appendectomy - laparoscopic and potential open techniques as well as scenarios where an ileocecectomy could be necessary. We discussed the material risks (including, but not limited to, pain, bleeding, infection, scarring, need for blood transfusion, damage to surrounding structures- blood vessels/nerves/viscus/organs, damage to ureter/bladder, urine leak, leak from staple line, need for additional procedures, hernia, recurrence although quite low, pneumonia, heart attack, stroke, death) benefits and alternatives to surgery were discussed. The patient's questions were answered to his satisfaction, he voiced understanding and elected to proceed with surgery. Additionally, we discussed typical postoperative expectations and the recovery process. -Discussed the surgery would be with my partner, Dr. Dwain Sarna, whom he would meet prior  Marin Olp, MD [facility] Use [email or web address] to contact on call provider

## 2021-01-20 NOTE — Op Note (Signed)
Preoperative diagnosis: Acute appendicitis Postoperative diagnosis: Same as above Procedure: Laparoscopic appendectomy Surgeon: Dr. Harden Mo Anesthesia: General Complications: Drains: None Estimated blood loss: Minimal Sponge and count was correct completion Specimens appendix to pathology Disposition to recovery stable condition  Indications: This is a 25 year old COVID-positive male who is asymptomatic who presents with abdominal pain, elevated white blood cell count, and a CT and exam consistent with acute appendicitis.  We discussed going to the operating room for laparoscopic appendectomy.  Procedure: After informed consent was obtained the patient was taken to the operating room.  He was already on antibiotics.  He had SCDs in place.  He was placed under general anesthesia without complication.  He was prepped and draped in the standard sterile surgical fashion.  A surgical timeout was then performed.  Made a vertical incision below his umbilicus after infiltrating Marcaine.  I then grasped his fascia.  I entered this sharply.  Entered his peritoneum bluntly.  There is no evidence of an entry injury.  I placed a 0 Vicryl pursestring suture through his fascia and inserted a Hassan trocar.  I then insufflated the abdomen to 15 mmHg pressure.  I then inserted 2 further 5 mm trochars under direct vision in the suprapubic and left lower quadrant.  He was noted to have acute appendicitis.  This was not perforated.  I was able to grasp the appendix.  I divided the appendiceal mesentery with the harmonic scalpel.  I took this down to the base.  I did mobilize his right colon and took down the white line to clearly see the base.  I then divided the appendix at the base with a small cuff of cecum using a GIA stapler.  I placed this in a retrieval bag and removed from the abdomen.  This was hemostatic.  I remove the umbilical trocar and tied the pursestring down.  I placed 2 additional 0 Vicryl  sutures using a suture passer device to completely obliterate this defect.  I then desufflated the abdomen to remove the remaining trochars.  These were closed with 4-0 Monocryl and glue.  He tolerated this well and will be transferred recovery stable condition.

## 2021-01-20 NOTE — Progress Notes (Signed)
Patients DAD Steve [phone]

## 2021-01-20 NOTE — Transfer of Care (Signed)
Immediate Anesthesia Transfer of Care Note  Patient: Clarence Hancock  Procedure(s) Performed: APPENDECTOMY LAPAROSCOPIC  Patient Location: PACU  Anesthesia Type:General  Level of Consciousness: drowsy  Airway & Oxygen Therapy: Patient Spontanous Breathing and Patient connected to nasal cannula oxygen  Post-op Assessment: Report given to RN and Post -op Vital signs reviewed and stable  Post vital signs: Reviewed and stable  Last Vitals:  Vitals Value Taken Time  BP 153/89 01/20/21 1616  Temp 36.3 C 01/20/21 1615  Pulse 61 01/20/21 1620  Resp 11 01/20/21 1620  SpO2 100 % 01/20/21 1620  Vitals shown include unvalidated device data.  Last Pain:  Vitals:   01/20/21 1152  TempSrc: Oral  PainSc:          Complications: No notable events documented.

## 2021-01-20 NOTE — ED Notes (Signed)
[name], mom, [phone] would like an update when available  

## 2021-01-20 NOTE — Anesthesia Procedure Notes (Signed)
Procedure Name: Intubation Date/Time: 01/20/2021 3:27 PM Performed by: Elliot Dally, CRNA Pre-anesthesia Checklist: Patient identified, Emergency Drugs available, Suction available and Patient being monitored Patient Re-evaluated:Patient Re-evaluated prior to induction Oxygen Delivery Method: Circle System Utilized Preoxygenation: Pre-oxygenation with 100% oxygen Induction Type: IV induction Ventilation: Mask ventilation without difficulty Laryngoscope Size: Miller and 2 Grade View: Grade I Tube type: Oral Tube size: 7.5 mm Number of attempts: 1 Airway Equipment and Method: Stylet and Oral airway Placement Confirmation: ETT inserted through vocal cords under direct vision, positive ETCO2 and breath sounds checked- equal and bilateral Secured at: 22 cm Tube secured with: Tape Dental Injury: Teeth and Oropharynx as per pre-operative assessment

## 2021-01-20 NOTE — Anesthesia Preprocedure Evaluation (Addendum)
Anesthesia Evaluation  Patient identified by MRN, date of birth, ID band Patient awake    Reviewed: Allergy & Precautions, NPO status , Patient's Chart, lab work & pertinent test results  Airway Mallampati: I  TM Distance: >3 FB Neck ROM: Full    Dental  (+) Teeth Intact, Dental Advisory Given   Pulmonary  Covid +   breath sounds clear to auscultation       Cardiovascular negative cardio ROS   Rhythm:Regular Rate:Normal     Neuro/Psych    GI/Hepatic   Endo/Other    Renal/GU negative Renal ROS     Musculoskeletal   Abdominal Normal abdominal exam  (+)   Peds  Hematology negative hematology ROS (+)   Anesthesia Other Findings   Reproductive/Obstetrics                            Anesthesia Physical Anesthesia Plan  ASA: 2  Anesthesia Plan: General   Post-op Pain Management:    Induction: Intravenous  PONV Risk Score and Plan: 3 and Ondansetron, Dexamethasone and Midazolam  Airway Management Planned: Oral ETT  Additional Equipment: None  Intra-op Plan:   Post-operative Plan: Extubation in OR  Informed Consent: I have reviewed the patients History and Physical, chart, labs and discussed the procedure including the risks, benefits and alternatives for the proposed anesthesia with the patient or authorized representative who has indicated his/her understanding and acceptance.       Plan Discussed with: CRNA  Anesthesia Plan Comments:        Anesthesia Quick Evaluation

## 2021-01-20 NOTE — Anesthesia Postprocedure Evaluation (Signed)
Anesthesia Post Note  Patient: Clarence Hancock  Procedure(s) Performed: APPENDECTOMY LAPAROSCOPIC     Patient location during evaluation: PACU Anesthesia Type: General Level of consciousness: awake and alert Pain management: pain level controlled Vital Signs Assessment: post-procedure vital signs reviewed and stable Respiratory status: spontaneous breathing, nonlabored ventilation, respiratory function stable and patient connected to nasal cannula oxygen Cardiovascular status: blood pressure returned to baseline and stable Postop Assessment: no apparent nausea or vomiting Anesthetic complications: no   No notable events documented.  Last Vitals:  Vitals:   01/20/21 1615 01/20/21 1631  BP: (!) 153/89 (!) 146/82  Pulse: (!) 58 (!) 50  Resp: 13 16  Temp: (!) 36.3 C 36.8 C  SpO2: 100% 100%    Last Pain:  Vitals:   01/20/21 1631  TempSrc:   PainSc: 0-No pain                 Shelton Silvas

## 2021-01-20 NOTE — Discharge Instructions (Signed)
[facility]  Please arrive at least 30 min before your appointment to complete your check in paperwork.  If you are unable to arrive 30 min prior to your appointment time we may have to cancel or reschedule you. LAPAROSCOPIC SURGERY: POST OP INSTRUCTIONS Always review your discharge instruction sheet given to you by the facility where your surgery was performed. IF YOU HAVE DISABILITY OR FAMILY LEAVE FORMS, YOU MUST BRING THEM TO THE OFFICE FOR PROCESSING.   DO NOT GIVE THEM TO YOUR DOCTOR.  PAIN CONTROL  First take acetaminophen (Tylenol) AND/or ibuprofen (Advil) to control your pain after surgery.  Follow directions on package.  Taking acetaminophen (Tylenol) and/or ibuprofen (Advil) regularly after surgery will help to control your pain and lower the amount of prescription pain medication you may need.  You should not take more than 4,000 mg (4 grams) of acetaminophen (Tylenol) in 24 hours.  You should not take ibuprofen (Advil), aleve, motrin, naprosyn or other NSAIDS if you have a history of stomach ulcers or chronic kidney disease.  A prescription for pain medication may be given to you upon discharge.  Take your pain medication as prescribed, if you still have uncontrolled pain after taking acetaminophen (Tylenol) or ibuprofen (Advil). Use ice packs to help control pain. If you need a refill on your pain medication, please contact your pharmacy.  They will contact our office to request authorization. Prescriptions will not be filled after 5pm or on week-ends.  HOME MEDICATIONS Take your usually prescribed medications unless otherwise directed.  DIET You should follow a light diet the first few days after arrival home.  Be sure to include lots of fluids daily. Avoid fatty, fried foods.   CONSTIPATION It is common to experience some constipation after surgery and if you are taking pain medication.  Increasing fluid intake and taking a stool softener (such as Colace)  will usually help or prevent this problem from occurring.  A mild laxative (Milk of Magnesia or Miralax) should be taken according to package instructions if there are no bowel movements after 48 hours.  WOUND/INCISION CARE Most patients will experience some swelling and bruising in the area of the incisions.  Ice packs will help.  Swelling and bruising can take several days to resolve.  Unless discharge instructions indicate otherwise, follow guidelines below  STERI-STRIPS - you may remove your outer bandages 48 hours after surgery, and you may shower at that time.  You have steri-strips (small skin tapes) in place directly over the incision.  These strips should be left on the skin for 7-10 days.   DERMABOND/SKIN GLUE - you may shower in 24 hours.  The glue will flake off over the next 2-3 weeks. Any sutures or staples will be removed at the office during your follow-up visit.  ACTIVITIES You may resume regular (light) daily activities beginning the next day--such as daily self-care, walking, climbing stairs--gradually increasing activities as tolerated.  You may have sexual intercourse when it is comfortable.  Refrain from any heavy lifting or straining until approved by your doctor. You may drive when you are no longer taking prescription pain medication, you can comfortably wear a seatbelt, and you can safely maneuver your car and apply brakes.  FOLLOW-UP You should see your doctor in the office for a follow-up appointment approximately 2-3 weeks after your surgery.  You should have been given your post-op/follow-up appointment when your surgery was scheduled.  If you did not receive a post-op/follow-up appointment, make sure   that you call for this appointment within a day or two after you arrive home to insure a convenient appointment time.   WHEN TO CALL YOUR DOCTOR: Fever over 101.0 Inability to urinate Continued bleeding from incision. Increased pain, redness, or drainage from the  incision. Increasing abdominal pain  The clinic staff is available to answer your questions during regular business hours.  Please don't hesitate to call and ask to speak to one of the nurses for clinical concerns.  If you have a medical emergency, go to the nearest emergency room or call 911.  A surgeon from [facility] is always on call at the hospital. [address], [address], [place], [place]  [address] ? [address] [address], [place], [place]   [address] [phone] ? [phone] ? [phone]  

## 2021-01-20 NOTE — ED Notes (Signed)
Obtained consent for procedure 

## 2021-01-20 NOTE — ED Notes (Signed)
Patient transported to CT 

## 2021-01-20 NOTE — ED Notes (Signed)
Clarence Hancock, mom, [phone] would like an update when available

## 2021-01-20 NOTE — ED Notes (Signed)
Placed all pt's belongings in bag- pants, shirt, shoes, sweater. Pt has cell phone in his possession. Pt undressed in gown ready for surgery.

## 2021-01-21 ENCOUNTER — Encounter (HOSPITAL_COMMUNITY): Payer: Self-pay | Admitting: General Surgery

## 2021-01-24 LAB — SURGICAL PATHOLOGY

## 2021-01-25 ENCOUNTER — Encounter (HOSPITAL_COMMUNITY): Payer: Self-pay | Admitting: General Surgery

## 2022-08-03 IMAGING — CT CT ABD-PELV W/ CM
2 of 4 series · 16 of 46 positions shown, 18 images · IV contrast (Omni 300)
Comparison: None.

CLINICAL DATA: 24-year-old male with right lower quadrant abdominal
pain.

EXAM:
CT ABDOMEN AND PELVIS WITH CONTRAST
TECHNIQUE: Multidetector CT imaging of the abdomen and pelvis was performed
using the standard protocol following bolus administration of
intravenous contrast.
CONTRAST:  100mL OMNIPAQUE IOHEXOL 300 MG/ML  SOLN

[Series 3: a/p w/ 5mm · axial · 0.79mm/px · z∈[+836,+1261]mm · 13 of 93 slices shown, 15 images]
[im 4/93  soft-tissue]
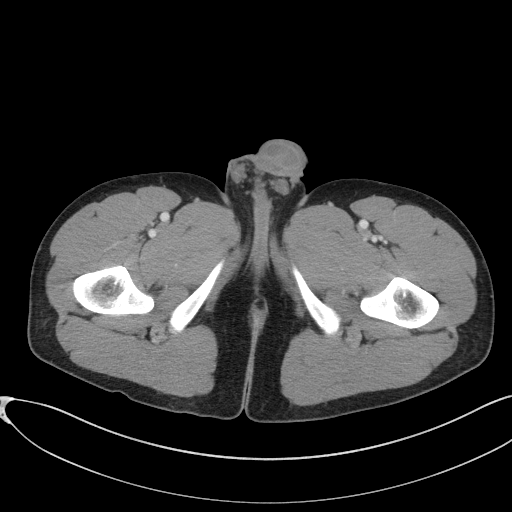
[im 4/93  bone]
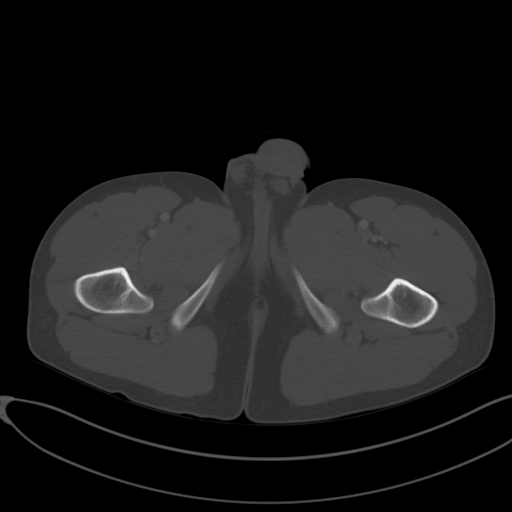
[im 12/93  soft-tissue]
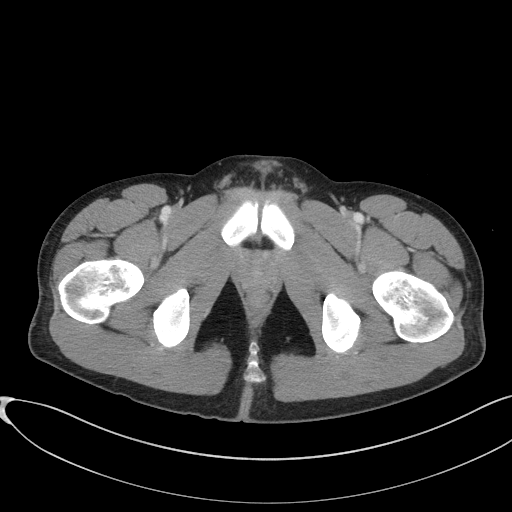
[im 19/93  soft-tissue]
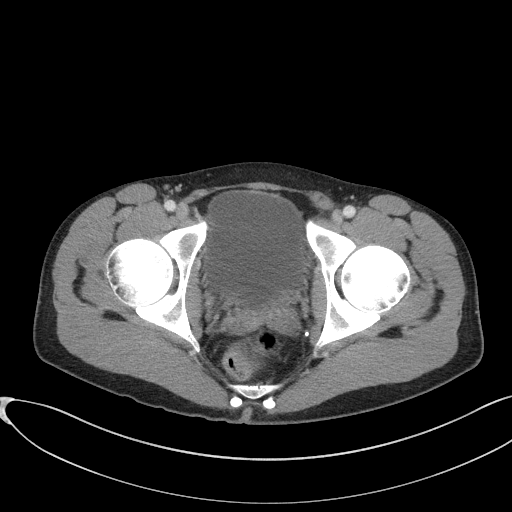
[im 26/93  soft-tissue]
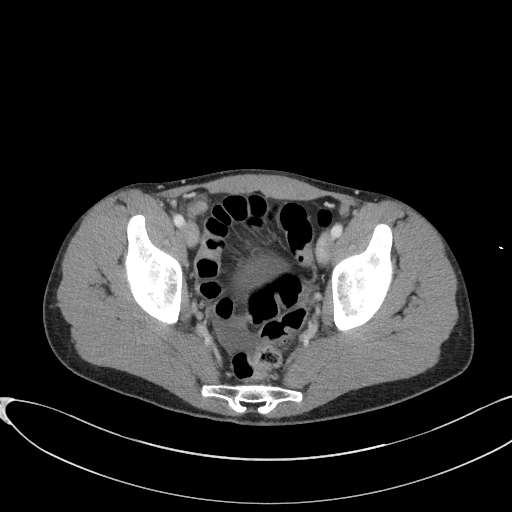
[im 34/93  soft-tissue]
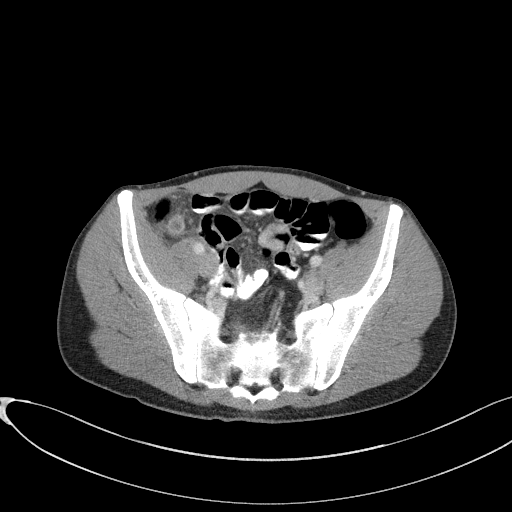
[im 41/93  soft-tissue]
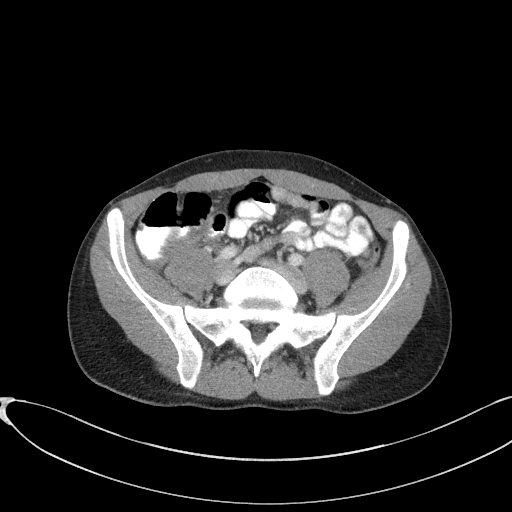
[im 48/93  soft-tissue]
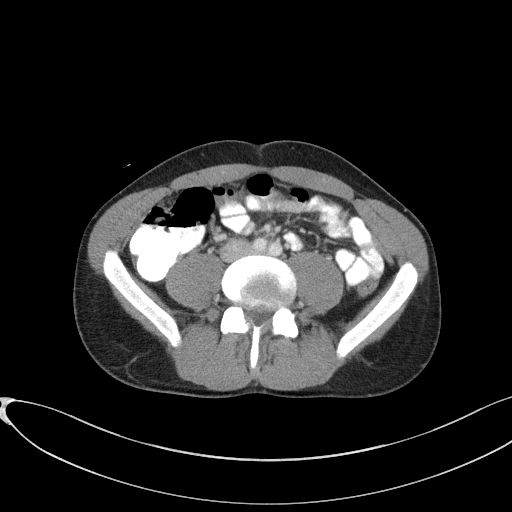
[im 52/93  soft-tissue]
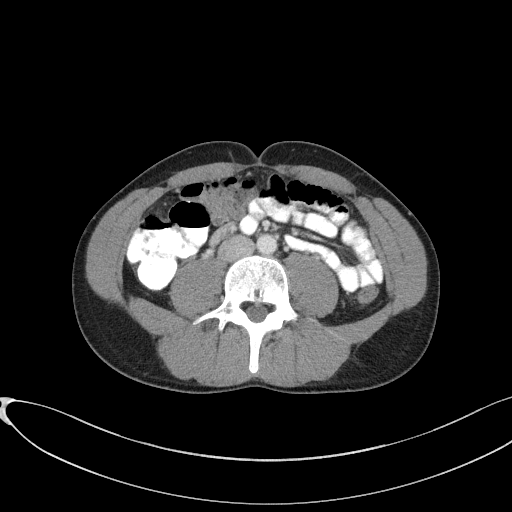
[im 59/93  soft-tissue]
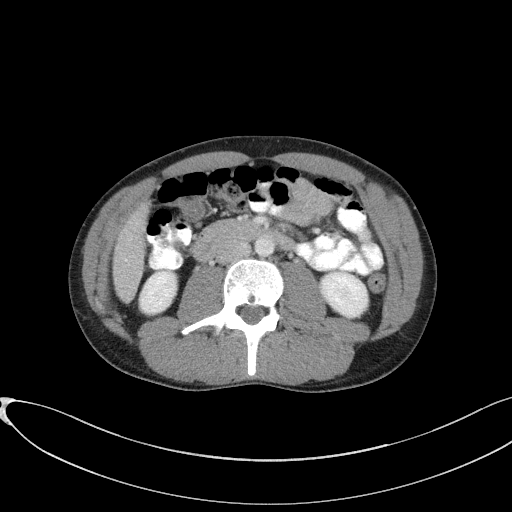
[im 59/93  bone]
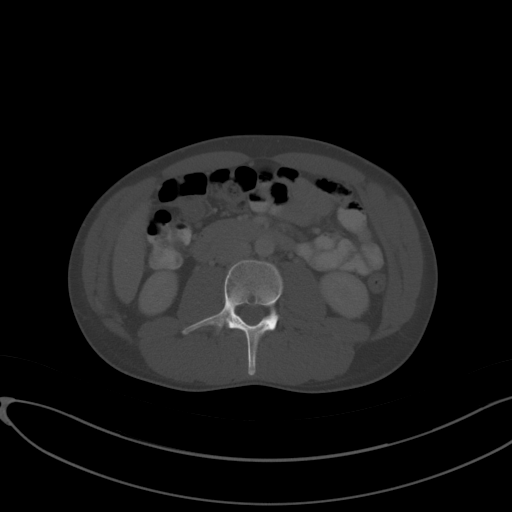
[im 67/93  soft-tissue]
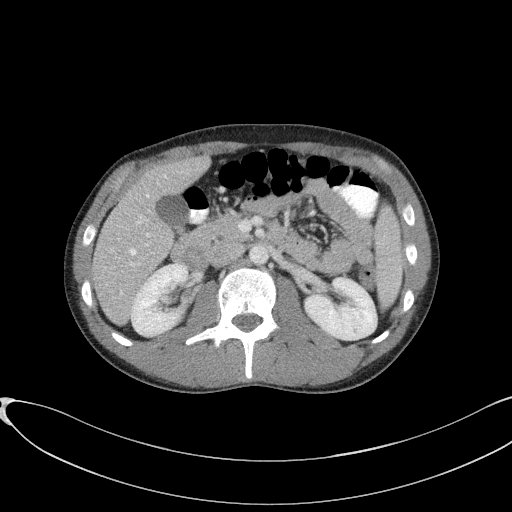
[im 74/93  soft-tissue]
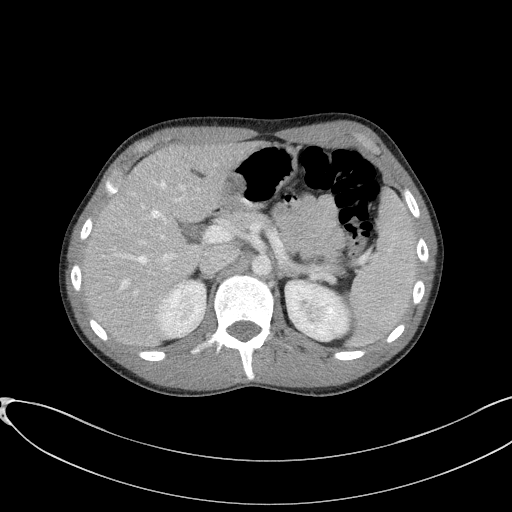
[im 81/93  soft-tissue]
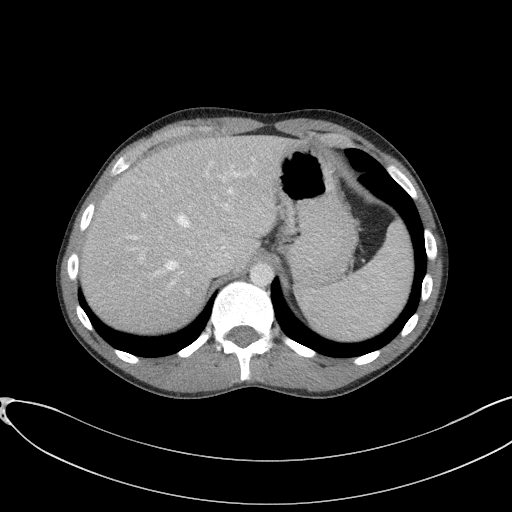
[im 89/93  soft-tissue]
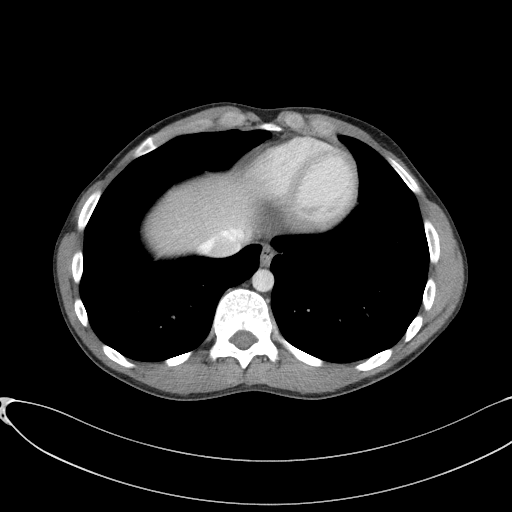

[Series 6: a/p w/ cor · coronal · 0.65mm/px · 3 of 151 slices shown]
[im 51/151  soft-tissue]
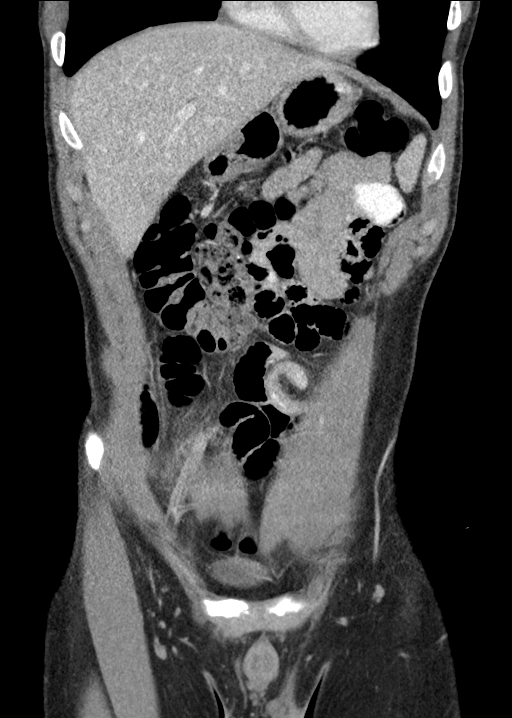
[im 67/151  soft-tissue]
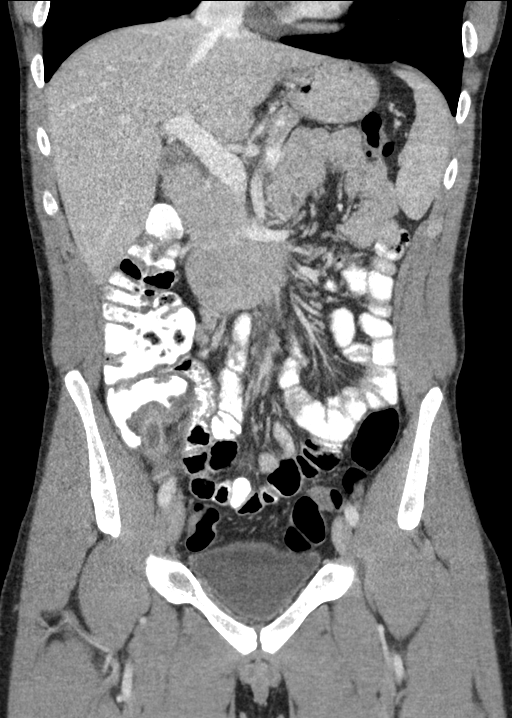
[im 84/151  soft-tissue]
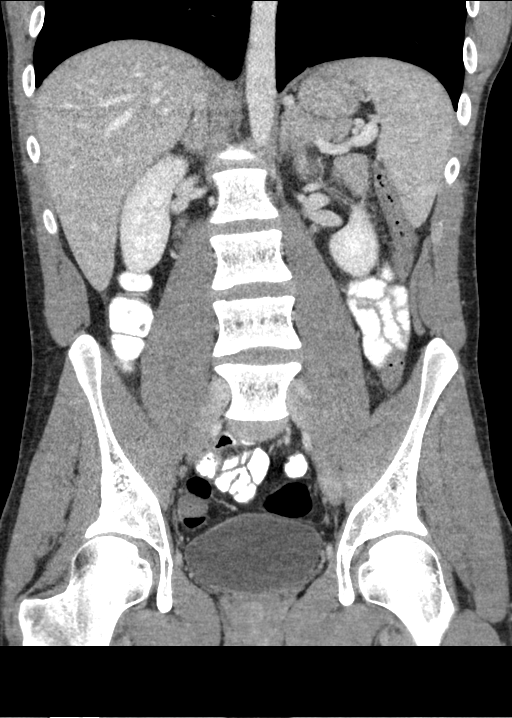

[16 of 46 positions shown; findings below may reference images not displayed]

FINDINGS: Lower chest: Negative.

Hepatobiliary: Negative liver and gallbladder.

Pancreas: Negative.

Spleen: Negative.

Adrenals/Urinary Tract: Negative.  Incidental pelvic phleboliths.

Stomach/Bowel:

Appendix: Location: Caudal to the cecum, arising on coronal image 67
and continuing through image 57.

Diameter: Up to 14 mm.

Appendicolith: None identified.

Mucosal hyper-enhancement: Positive

Extraluminal gas: Negative

Periappendiceal collection: Trace free fluid in the right lower
quadrant, otherwise only a para appendiceal inflammatory stranding.

Associated thickening at the tip of the cecum seen on coronal image
67. Oral contrast has reached the hepatic flexure. The sigmoid colon
is redundant. Stomach, duodenum, and small bowel remain within
normal limits. No free air.

Vascular/Lymphatic: Major arterial structures and the portal venous
system appear to be patent and normal. No lymphadenopathy
identified.

Reproductive: Negative.

Other: Moderate volume pelvic free fluid with simple to mildly
complex fluid density.

Musculoskeletal: Negative.
IMPRESSION: Positive for Acute Appendicitis.

No periappendiceal abscess, and no extraluminal gas to suggest
perforation. A moderate volume of pelvic free fluid is therefore
felt to be reactive.
